# Patient Record
Sex: Female | Born: 1980 | Race: Black or African American | Hispanic: No | Marital: Single
Health system: Southern US, Community
[De-identification: ages and names within clinical notes are randomized; demographics above are authoritative.]

---

## 2000-12-01 ENCOUNTER — Emergency Department (HOSPITAL_COMMUNITY): Admission: EM | Admit: 2000-12-01 | Discharge: 2000-12-01 | Payer: Self-pay | Admitting: *Deleted

## 2000-12-01 ENCOUNTER — Encounter: Payer: Self-pay | Admitting: Emergency Medicine

## 2002-09-02 ENCOUNTER — Encounter: Payer: Self-pay | Admitting: Emergency Medicine

## 2002-09-02 ENCOUNTER — Emergency Department (HOSPITAL_COMMUNITY): Admission: EM | Admit: 2002-09-02 | Discharge: 2002-09-02 | Payer: Self-pay | Admitting: Emergency Medicine

## 2002-11-12 ENCOUNTER — Emergency Department (HOSPITAL_COMMUNITY): Admission: EM | Admit: 2002-11-12 | Discharge: 2002-11-12 | Payer: Self-pay | Admitting: Emergency Medicine

## 2002-11-12 ENCOUNTER — Encounter: Payer: Self-pay | Admitting: Emergency Medicine

## 2003-01-01 ENCOUNTER — Emergency Department (HOSPITAL_COMMUNITY): Admission: EM | Admit: 2003-01-01 | Discharge: 2003-01-01 | Payer: Self-pay | Admitting: Emergency Medicine

## 2005-03-13 ENCOUNTER — Encounter: Admission: RE | Admit: 2005-03-13 | Discharge: 2005-03-13 | Payer: Self-pay | Admitting: Orthopedic Surgery

## 2006-09-03 ENCOUNTER — Emergency Department (HOSPITAL_COMMUNITY): Admission: EM | Admit: 2006-09-03 | Discharge: 2006-09-04 | Payer: Self-pay | Admitting: Emergency Medicine

## 2007-07-10 ENCOUNTER — Emergency Department (HOSPITAL_COMMUNITY): Admission: EM | Admit: 2007-07-10 | Discharge: 2007-07-10 | Payer: Self-pay | Admitting: Emergency Medicine

## 2007-07-20 ENCOUNTER — Emergency Department (HOSPITAL_COMMUNITY): Admission: EM | Admit: 2007-07-20 | Discharge: 2007-07-20 | Payer: Self-pay | Admitting: Emergency Medicine

## 2008-02-12 ENCOUNTER — Emergency Department (HOSPITAL_COMMUNITY): Admission: EM | Admit: 2008-02-12 | Discharge: 2008-02-12 | Payer: Self-pay | Admitting: Emergency Medicine

## 2008-02-28 ENCOUNTER — Emergency Department (HOSPITAL_COMMUNITY): Admission: EM | Admit: 2008-02-28 | Discharge: 2008-02-28 | Payer: Self-pay | Admitting: Emergency Medicine

## 2008-04-17 ENCOUNTER — Emergency Department (HOSPITAL_COMMUNITY): Admission: EM | Admit: 2008-04-17 | Discharge: 2008-04-17 | Payer: Self-pay | Admitting: Emergency Medicine

## 2008-07-01 ENCOUNTER — Emergency Department (HOSPITAL_COMMUNITY): Admission: EM | Admit: 2008-07-01 | Discharge: 2008-07-01 | Payer: Self-pay | Admitting: Family Medicine

## 2010-10-23 ENCOUNTER — Emergency Department (HOSPITAL_COMMUNITY)
Admission: EM | Admit: 2010-10-23 | Discharge: 2010-10-23 | Disposition: A | Discharge status: Home / Self Care | Payer: Self-pay | Attending: Emergency Medicine | Admitting: Emergency Medicine

## 2010-10-23 ENCOUNTER — Emergency Department (HOSPITAL_COMMUNITY): Payer: Self-pay

## 2010-10-23 DIAGNOSIS — E876 Hypokalemia: Secondary | ICD-10-CM | POA: Insufficient documentation

## 2010-10-23 DIAGNOSIS — Z7901 Long term (current) use of anticoagulants: Secondary | ICD-10-CM | POA: Insufficient documentation

## 2010-10-23 DIAGNOSIS — M62838 Other muscle spasm: Secondary | ICD-10-CM | POA: Insufficient documentation

## 2010-10-23 DIAGNOSIS — Z86718 Personal history of other venous thrombosis and embolism: Secondary | ICD-10-CM | POA: Insufficient documentation

## 2010-10-23 DIAGNOSIS — R209 Unspecified disturbances of skin sensation: Secondary | ICD-10-CM | POA: Insufficient documentation

## 2010-10-23 DIAGNOSIS — R079 Chest pain, unspecified: Secondary | ICD-10-CM | POA: Insufficient documentation

## 2010-10-23 DIAGNOSIS — M79609 Pain in unspecified limb: Secondary | ICD-10-CM | POA: Insufficient documentation

## 2010-10-23 LAB — MAGNESIUM: Magnesium: 1.9 mg/dL (ref 1.5–2.5)

## 2010-10-23 LAB — DIFFERENTIAL
Eosinophils Relative: 2 % (ref 0–5)
Lymphocytes Relative: 37 % (ref 12–46)
Lymphs Abs: 3.4 10*3/uL (ref 0.7–4.0)
Monocytes Absolute: 0.7 10*3/uL (ref 0.1–1.0)
Neutro Abs: 4.8 10*3/uL (ref 1.7–7.7)

## 2010-10-23 LAB — CBC
HCT: 30 % — ABNORMAL LOW (ref 36.0–46.0)
Hemoglobin: 9.8 g/dL — ABNORMAL LOW (ref 12.0–15.0)
MCHC: 32.7 g/dL (ref 30.0–36.0)
MCV: 80.6 fL (ref 78.0–100.0)
RDW: 15 % (ref 11.5–15.5)

## 2010-10-23 LAB — PROTIME-INR: INR: 1.29 (ref 0.00–1.49)

## 2010-10-23 LAB — BASIC METABOLIC PANEL
BUN: 7 mg/dL (ref 6–23)
CO2: 22 mEq/L (ref 19–32)
Chloride: 106 mEq/L (ref 96–112)
Creatinine, Ser: 0.57 mg/dL (ref 0.4–1.2)
GFR calc Af Amer: >60 mL/min (ref >60)
Potassium: 3.4 mEq/L — ABNORMAL LOW (ref 3.5–5.1)

## 2011-06-08 LAB — WOUND CULTURE
Culture: NO GROWTH
Gram Stain: NONE SEEN

## 2011-06-20 LAB — POCT URINALYSIS DIP (DEVICE)
Bilirubin Urine: NEGATIVE
Glucose, UA: NEGATIVE
Nitrite: NEGATIVE
Operator id: 126491
Urobilinogen, UA: 0.2

## 2011-06-20 LAB — WET PREP, GENITAL
WBC, Wet Prep HPF POC: NONE SEEN
Yeast Wet Prep HPF POC: NONE SEEN

## 2011-06-20 LAB — POCT PREGNANCY, URINE: Preg Test, Ur: POSITIVE

## 2011-06-21 LAB — POCT URINALYSIS DIP (DEVICE)
Nitrite: NEGATIVE
Operator id: 239701
Protein, ur: 100 — AB
Urobilinogen, UA: 0.2
pH: 6

## 2011-06-21 LAB — POCT PREGNANCY, URINE
Operator id: 239701
Preg Test, Ur: POSITIVE

## 2011-11-20 IMAGING — CR DG CHEST 2V
2 series · 2 of 2 positions shown · non-contrast
Comparison: None

CLINICAL DATA: Chest pain.

CHEST - 2 VIEW

[w chest pa]
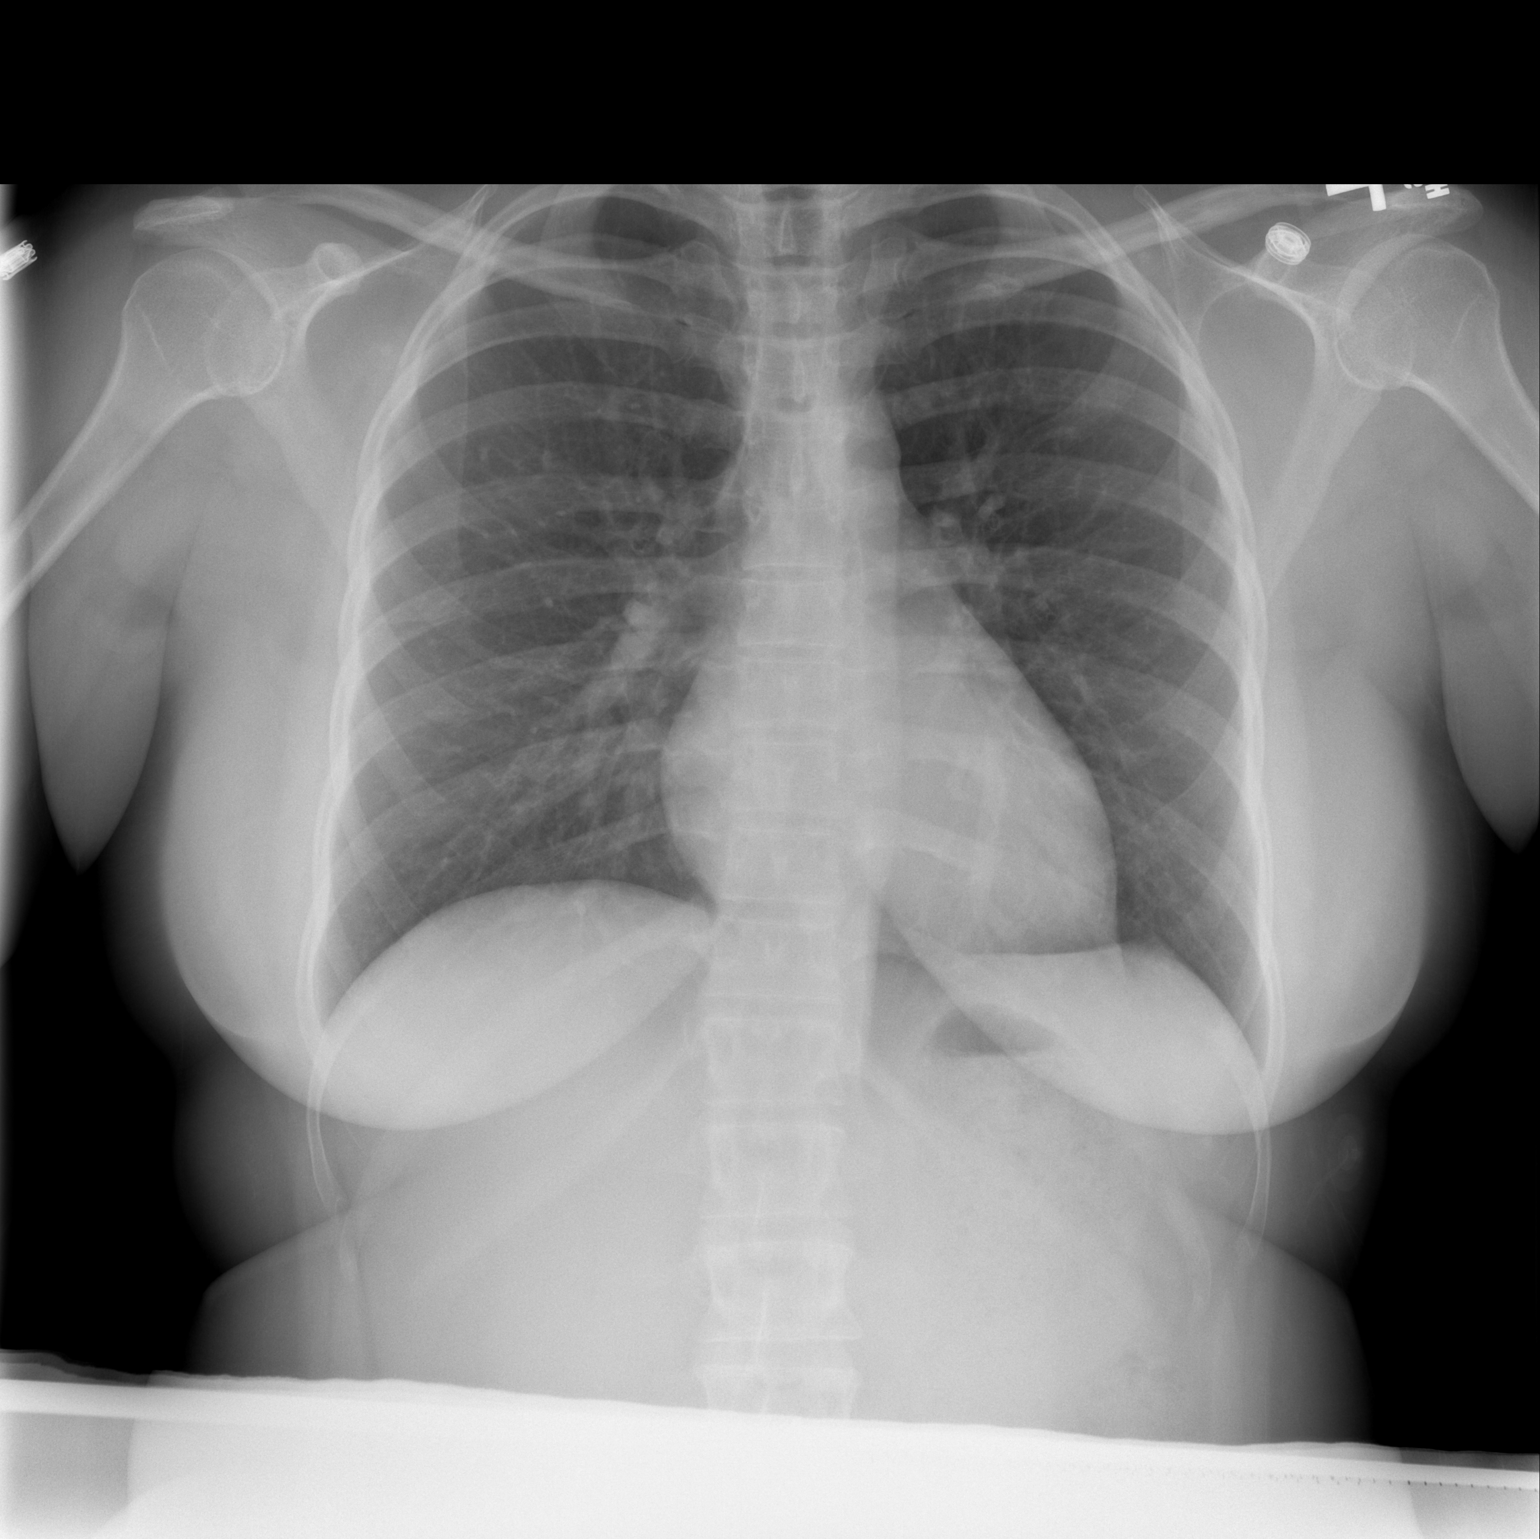

[w chest lat]
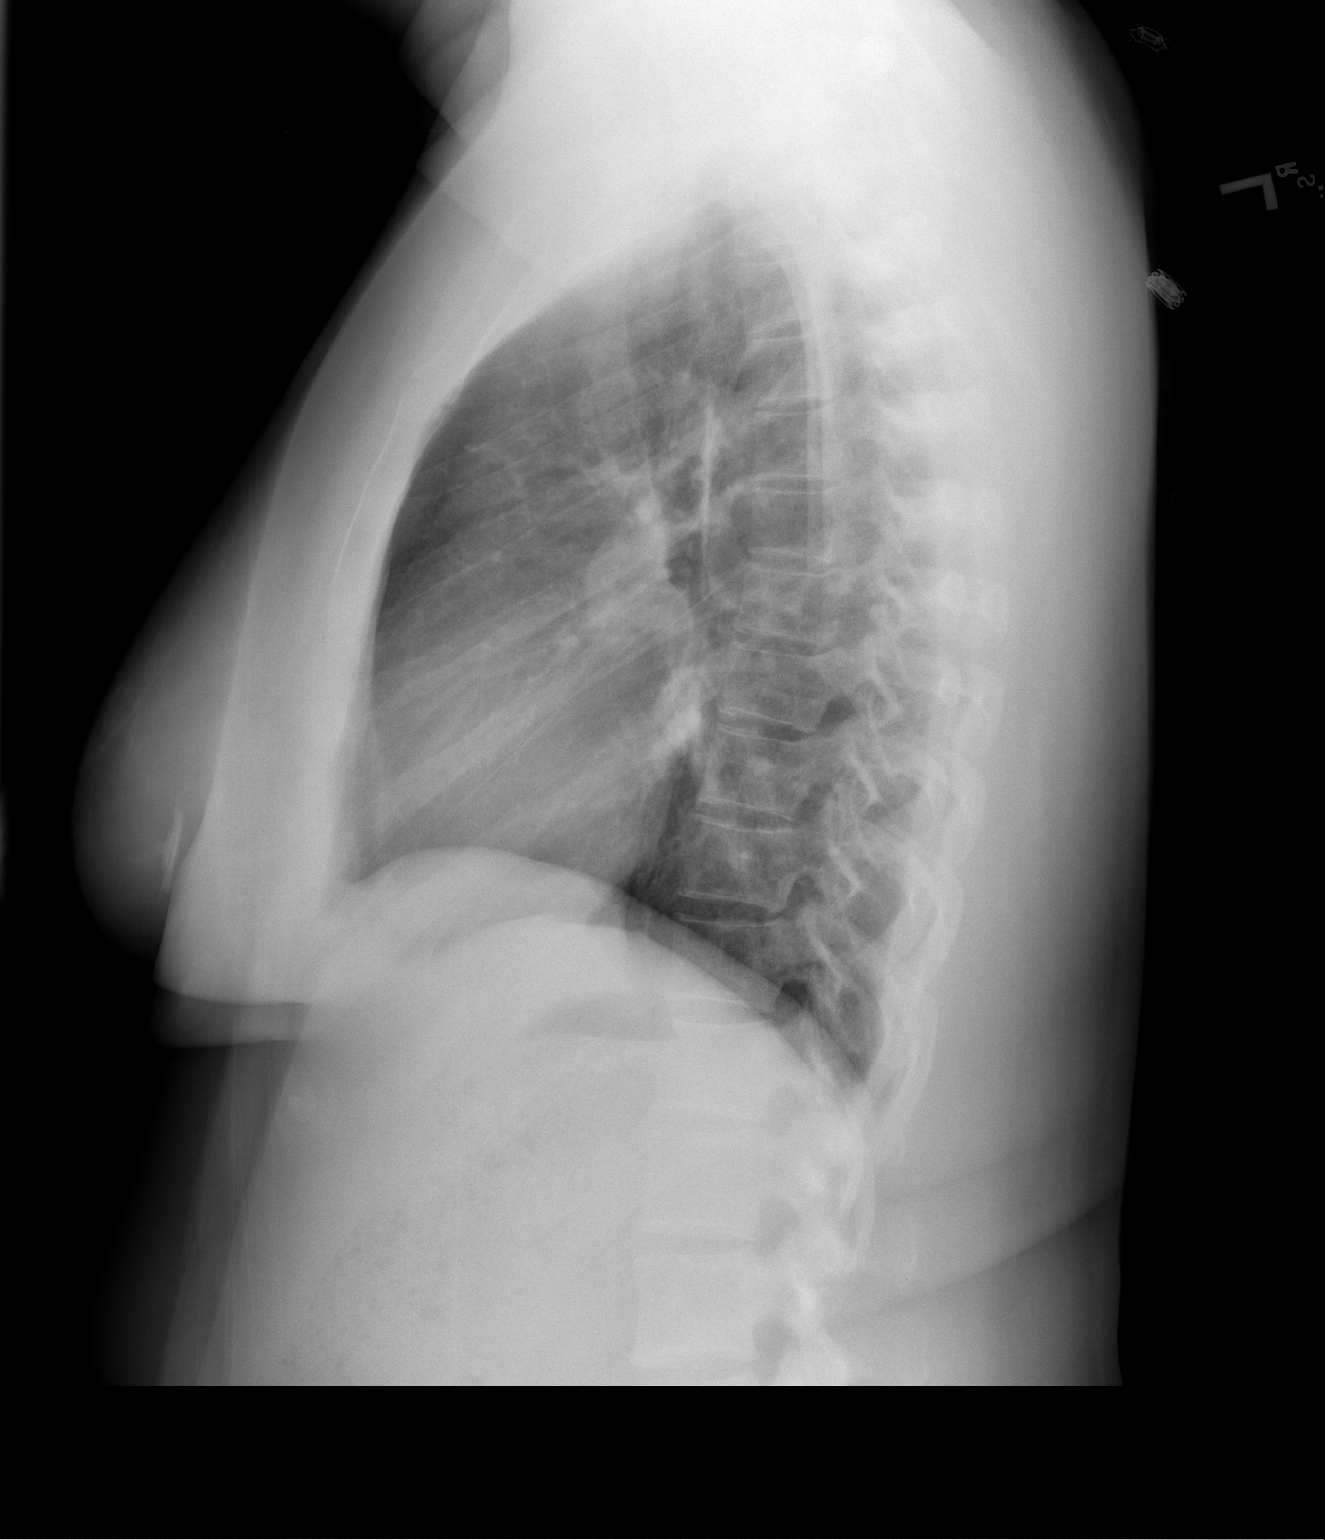

[2 of 2 positions shown; findings below may reference images not displayed]

FINDINGS: The cardiomediastinal silhouette is unremarkable.
The lungs are clear.
There is no evidence of focal airspace disease, pulmonary edema,
pulmonary nodule/mass, pleural effusion, or pneumothorax.
No acute bony abnormalities are identified.
IMPRESSION: No evidence of active cardiopulmonary disease.

## 2021-04-15 ENCOUNTER — Emergency Department (HOSPITAL_COMMUNITY)
Admission: EM | Admit: 2021-04-15 | Discharge: 2021-04-16 | Disposition: A | Discharge status: Home / Self Care | Payer: 59 | Attending: Emergency Medicine | Admitting: Emergency Medicine

## 2021-04-15 ENCOUNTER — Other Ambulatory Visit: Payer: Self-pay

## 2021-04-15 DIAGNOSIS — R059 Cough, unspecified: Secondary | ICD-10-CM | POA: Diagnosis present

## 2021-04-15 DIAGNOSIS — B349 Viral infection, unspecified: Secondary | ICD-10-CM | POA: Diagnosis not present

## 2021-04-15 LAB — CBC WITH DIFFERENTIAL/PLATELET
Abs Immature Granulocytes: 0.04 10*3/uL (ref 0.00–0.07)
Basophils Absolute: 0.1 10*3/uL (ref 0.0–0.1)
Basophils Relative: 1 %
Eosinophils Absolute: 0.1 10*3/uL (ref 0.0–0.5)
Eosinophils Relative: 2 %
HCT: 33.9 % — ABNORMAL LOW (ref 36.0–46.0)
Hemoglobin: 10.7 g/dL — ABNORMAL LOW (ref 12.0–15.0)
Immature Granulocytes: 1 %
Lymphocytes Relative: 33 %
Lymphs Abs: 2.9 10*3/uL (ref 0.7–4.0)
MCH: 27 pg (ref 26.0–34.0)
MCHC: 31.6 g/dL (ref 30.0–36.0)
MCV: 85.6 fL (ref 80.0–100.0)
Monocytes Absolute: 0.7 10*3/uL (ref 0.1–1.0)
Monocytes Relative: 8 %
Neutro Abs: 5.1 10*3/uL (ref 1.7–7.7)
Neutrophils Relative %: 55 %
Platelets: 611 10*3/uL — ABNORMAL HIGH (ref 150–400)
RBC: 3.96 MIL/uL (ref 3.87–5.11)
RDW: 13.6 % (ref 11.5–15.5)
WBC: 8.8 10*3/uL (ref 4.0–10.5)
nRBC: 0 % (ref 0.0–0.2)

## 2021-04-15 NOTE — ED Triage Notes (Signed)
Been feeling the same way x few days - multiple medical complaints - fatigued, weak, headache, loss of appetite.   COVID negative.

## 2021-04-16 LAB — COMPREHENSIVE METABOLIC PANEL
ALT: 18 U/L (ref 0–44)
AST: 16 U/L (ref 15–41)
Albumin: 3.6 g/dL (ref 3.5–5.0)
Alkaline Phosphatase: 77 U/L (ref 38–126)
Anion gap: 8 (ref 5–15)
BUN: 8 mg/dL (ref 6–20)
CO2: 27 mmol/L (ref 22–32)
Calcium: 9.1 mg/dL (ref 8.9–10.3)
Chloride: 101 mmol/L (ref 98–111)
Creatinine, Ser: 0.64 mg/dL (ref 0.44–1.00)
GFR, Estimated: >60 mL/min (ref >60)
Glucose, Bld: 170 mg/dL — ABNORMAL HIGH (ref 70–99)
Potassium: 3.4 mmol/L — ABNORMAL LOW (ref 3.5–5.1)
Sodium: 136 mmol/L (ref 135–145)
Total Bilirubin: 0.9 mg/dL (ref 0.3–1.2)
Total Protein: 7.4 g/dL (ref 6.5–8.1)

## 2021-04-16 NOTE — ED Provider Notes (Signed)
[facility] Emergency Department Provider Note MRN:  [id number]  Arrival date & time: 04/16/21     Chief Complaint   Fatigue, Generalized Body Aches, and multiple medical complaints   History of Present Illness   Marie Oneal is a 40 y.o. year-old female with a history of fibromyalgia presenting to the ED with chief complaint of body aches.  Patient began having symptoms 6 days ago with diarrhea that lasted 3 days.  She is not experiencing continued malaise, fatigue, body aches, headaches, cough.  Denies chest pain or shortness of breath, no abdominal pain.  Intermittent fevers.  Tested negative for COVID at urgent care.  Here because she still feels bad and cannot sleep.  Symptoms are moderate, constant, no exacerbating or alleviating factors.  Review of Systems  A complete 10 system review of systems was obtained and all systems are negative except as noted in the HPI and PMH.   Patient's Health History   No past medical history on file.    No family history on file.  Social History   Socioeconomic History   Marital status: Single    Spouse name: Not on file   Number of children: Not on file   Years of education: Not on file   Highest education level: Not on file  Occupational History   Not on file  Tobacco Use   Smoking status: Not on file   Smokeless tobacco: Not on file  Substance and Sexual Activity   Alcohol use: Not on file   Drug use: Not on file   Sexual activity: Not on file  Other Topics Concern   Not on file  Social History Narrative   Not on file   Social Determinants of Health   Financial Resource Strain: Not on file  Food Insecurity: Not on file  Transportation Needs: Not on file  Physical Activity: Not on file  Stress: Not on file  Social Connections: Not on file  Intimate Partner Violence: Not on file     Physical Exam   Vitals:   04/15/21 2225 04/16/21 0210  BP: (!) 145/96 121/70  Pulse: 88 87  Resp: 16 18   Temp: 98.5 F (36.9 C) 98.5 F (36.9 C)  SpO2: 100% 98%    CONSTITUTIONAL: Well-appearing, NAD NEURO:  Alert and oriented x 3, no focal deficits EYES:  eyes equal and reactive ENT/NECK:  no LAD, no JVD CARDIO: Regular rate, well-perfused, normal S1 and S2 PULM:  CTAB no wheezing or rhonchi GI/GU:  normal bowel sounds, non-distended, non-tender MSK/SPINE:  No gross deformities, no edema SKIN:  no rash, atraumatic PSYCH:  Appropriate speech and behavior  *Additional and/or pertinent findings included in MDM below  Diagnostic and Interventional Summary    EKG Interpretation  Date/Time:    Ventricular Rate:    PR Interval:    QRS Duration:   QT Interval:    QTC Calculation:   R Axis:     Text Interpretation:         Labs Reviewed  CBC WITH DIFFERENTIAL/PLATELET - Abnormal; Notable for the following components:      Result Value   Hemoglobin 10.7 (*)    HCT 33.9 (*)    Platelets 611 (*)    All other components within normal limits  COMPREHENSIVE METABOLIC PANEL - Abnormal; Notable for the following components:   Potassium 3.4 (*)    Glucose, Bld 170 (*)    All other components within normal limits    No orders  to display    Medications - No data to display   Procedures  /  Critical Care Procedures  ED Course and Medical Decision Making  I have reviewed the triage vital signs, the nursing notes, and pertinent available records from the EMR.  Listed above are laboratory and imaging tests that I personally ordered, reviewed, and interpreted and then considered in my medical decision making (see below for details).  Myriad of symptoms consistent with viral illness.  No chest pain or shortness of breath, vital signs are normal, no tachycardia, no hypoxia.  She is sitting comfortably in no acute distress, no increased work of breathing, lungs are clear, abdomen soft nontender.  Provided reassurance, appropriate for discharge.       Barth Kirks. [place] Small, [place] [email or web address]  Final Clinical Impressions(s) / ED Diagnoses     ICD-10-CM   1. Viral illness  B34.9       ED Discharge Orders     None        Discharge Instructions Discussed with and Provided to Patient:    Discharge Instructions      You were evaluated in the Emergency Department and after careful evaluation, we did not find any emergent condition requiring admission or further testing in the hospital.  Your exam/testing today was overall reassuring.  Symptoms seem to be due to a viral illness.  Recommend Tylenol or Motrin at home for discomfort, plenty of fluids.  Should be feeling better over the next few days.  Please return to the Emergency Department if you experience any worsening of your condition.  Thank you for allowing [place] to be a part of your care.        Maudie Flakes, MD 04/16/21 [phone]

## 2021-04-16 NOTE — ED Notes (Signed)
I was unable to interview this pt   When askingabout the reason shes here she replied that unless I was the doctor she had already told the people up front and she did not want to tell me.  So ok

## 2021-04-16 NOTE — Discharge Instructions (Addendum)
You were evaluated in the Emergency Department and after careful evaluation, we did not find any emergent condition requiring admission or further testing in the hospital.  Your exam/testing today was overall reassuring.  Symptoms seem to be due to a viral illness.  Recommend Tylenol or Motrin at home for discomfort, plenty of fluids.  Should be feeling better over the next few days.  Please return to the Emergency Department if you experience any worsening of your condition.  Thank you for allowing [place] to be a part of your care.
